# Patient Record
Sex: Male | Born: 1970 | Race: White | Hispanic: No | Marital: Married | Smoking: Former smoker
Health system: Southern US, Community
[De-identification: ages and names within clinical notes are randomized; demographics above are authoritative.]

## PROBLEM LIST (undated history)

## (undated) DIAGNOSIS — K219 Gastro-esophageal reflux disease without esophagitis: Secondary | ICD-10-CM

## (undated) DIAGNOSIS — E785 Hyperlipidemia, unspecified: Secondary | ICD-10-CM

## (undated) DIAGNOSIS — L309 Dermatitis, unspecified: Secondary | ICD-10-CM

## (undated) DIAGNOSIS — J302 Other seasonal allergic rhinitis: Secondary | ICD-10-CM

## (undated) DIAGNOSIS — L639 Alopecia areata, unspecified: Secondary | ICD-10-CM

## (undated) DIAGNOSIS — G4733 Obstructive sleep apnea (adult) (pediatric): Secondary | ICD-10-CM

## (undated) DIAGNOSIS — T7840XA Allergy, unspecified, initial encounter: Secondary | ICD-10-CM

## (undated) HISTORY — DX: Other seasonal allergic rhinitis: J30.2

## (undated) HISTORY — DX: Dermatitis, unspecified: L30.9

## (undated) HISTORY — PX: EYE SURGERY: SHX253

## (undated) HISTORY — DX: Obstructive sleep apnea (adult) (pediatric): G47.33

## (undated) HISTORY — DX: Hyperlipidemia, unspecified: E78.5

## (undated) HISTORY — DX: Alopecia areata, unspecified: L63.9

## (undated) HISTORY — DX: Gastro-esophageal reflux disease without esophagitis: K21.9

## (undated) HISTORY — DX: Allergy, unspecified, initial encounter: T78.40XA

---

## 2010-08-19 ENCOUNTER — Encounter: Payer: Self-pay | Admitting: Pulmonary Disease

## 2010-08-20 ENCOUNTER — Ambulatory Visit (INDEPENDENT_AMBULATORY_CARE_PROVIDER_SITE_OTHER): Payer: 59 | Admitting: Pulmonary Disease

## 2010-08-20 ENCOUNTER — Encounter: Payer: Self-pay | Admitting: Pulmonary Disease

## 2010-08-20 VITALS — BP 118/80 | HR 86 | Temp 98.0°F | Ht 71.0 in | Wt 200.4 lb

## 2010-08-20 DIAGNOSIS — G4733 Obstructive sleep apnea (adult) (pediatric): Secondary | ICD-10-CM

## 2010-08-20 NOTE — Patient Instructions (Signed)
Work on modest weight loss Will refer to ENT for upper airway evaluation. Review material on dental appliance, and let me know if you would like referral to dental medicine for evaluation.

## 2010-08-20 NOTE — Progress Notes (Signed)
  Subjective:    Patient ID: Mark Obrien, male    DOB: 21-Feb-1970, 40 y.o.   MRN: [id number]  HPI The pt comes in today for ongoing issues with sleep.  He has been diagnosed with mild osa, and has been trying to wear cpap for treatment.  He has not been seen since 2009, and has been wearing cpap intermittently.  He has had ongoing issues with mask fit, and states that even when he wears cpap he continues to snore and sleep poorly.    Sleep Questionnaire: What time do you typically go to bed?( Between what hours) 11:30 pm to 1 am How long does it take you to fall asleep? 1 min How many times during the night do you wake up? What time do you get out of bed to start your day? 0730 Do you drive or operate heavy machinery in your occupation? How much has your weight changed (up or down) over the past two years? (In pounds) 20 lb (9.072 kg) Have you ever had a sleep study before? Yes If yes, location of study? [facility] If yes, date of study? 03/2007 and 05/2007 Do you currently use CPAP? Yes If so, what pressure? 11? Do you wear oxygen at any time? No     Review of Systems  Constitutional: Positive for unexpected weight change. Negative for fever.  HENT: Positive for congestion and dental problem. Negative for ear pain, nosebleeds, sore throat, rhinorrhea, sneezing, trouble swallowing, postnasal drip and sinus pressure.   Eyes: Negative for redness and itching.  Respiratory: Negative for cough, chest tightness, shortness of breath and wheezing.   Cardiovascular: Positive for chest pain. Negative for palpitations and leg swelling.  Gastrointestinal: Negative for nausea and vomiting.  Genitourinary: Negative for dysuria.  Musculoskeletal: Positive for joint swelling.  Skin: Negative for rash.  Neurological: Positive for headaches.  Hematological: Does not bruise/bleed easily.  Psychiatric/Behavioral: Negative for dysphoric mood. The patient is nervous/anxious.        Objective:   Physical  Exam Constitutional:  Well developed, no acute distress  HENT:  Nares with deviation to right with complete obstruction, left narrowed with turbinate hypertrophy  Oropharynx without exudate, palate and uvula are moderately elongated.   Eyes:  Perrla, eomi, no scleral icterus  Neck:  No JVD, no TMG  Cardiovascular:  Normal rate, regular rhythm, no rubs or gallops.  No murmurs        Intact distal pulses  Pulmonary :  Normal breath sounds, no stridor or respiratory distress   No rales, rhonchi, or wheezing  Abdominal:  Soft, nondistended, bowel sounds present.  No tenderness noted.   Musculoskeletal:  No lower extremity edema noted.  Lymph Nodes:  No cervical lymphadenopathy noted  Skin:  No cyanosis noted  Neurologic:  Alert, appropriate, moves all 4 extremities without obvious deficit.         Assessment & Plan:

## 2010-08-24 ENCOUNTER — Encounter: Payer: Self-pay | Admitting: Pulmonary Disease

## 2010-08-25 NOTE — Assessment & Plan Note (Addendum)
The pt has mild osa by his sleep study in the past, and has been doing poorly with cpap for various reasons.  Mask fitting has been difficult, and he has been having breakthru snoring either due to pressure loss or inadequate pressure delivery.  I have offered to work with him on cpap fitting and troubleshooting, but he would like to consider other options.  Have discussed upper airway surgery, especially with his abnormal nasal anatomy, as well as a dental appliance.  Literature was given to the pt about a dental appliance, and referral will be made to ENT.  I have also encouraged him to work on weight loss.

## 2013-06-28 ENCOUNTER — Other Ambulatory Visit: Payer: Self-pay | Admitting: Family Medicine

## 2013-06-28 ENCOUNTER — Ambulatory Visit
Admission: RE | Admit: 2013-06-28 | Discharge: 2013-06-28 | Disposition: A | Discharge status: Home / Self Care | Payer: BC Managed Care – PPO | Source: Ambulatory Visit | Attending: Family Medicine | Admitting: Family Medicine

## 2013-06-28 DIAGNOSIS — N5089 Other specified disorders of the male genital organs: Secondary | ICD-10-CM

## 2014-08-14 ENCOUNTER — Encounter: Payer: Self-pay | Admitting: Family

## 2014-08-14 ENCOUNTER — Ambulatory Visit (INDEPENDENT_AMBULATORY_CARE_PROVIDER_SITE_OTHER): Payer: 59 | Admitting: Family

## 2014-08-14 ENCOUNTER — Telehealth: Payer: Self-pay | Admitting: Family

## 2014-08-14 ENCOUNTER — Ambulatory Visit: Payer: 59 | Admitting: Family

## 2014-08-14 ENCOUNTER — Encounter (INDEPENDENT_AMBULATORY_CARE_PROVIDER_SITE_OTHER): Payer: Self-pay

## 2014-08-14 ENCOUNTER — Other Ambulatory Visit (INDEPENDENT_AMBULATORY_CARE_PROVIDER_SITE_OTHER): Payer: 59

## 2014-08-14 VITALS — BP 110/82 | HR 69 | Temp 98.2°F | Resp 18 | Ht 70.5 in | Wt 210.0 lb

## 2014-08-14 DIAGNOSIS — K219 Gastro-esophageal reflux disease without esophagitis: Secondary | ICD-10-CM | POA: Diagnosis not present

## 2014-08-14 DIAGNOSIS — E785 Hyperlipidemia, unspecified: Secondary | ICD-10-CM | POA: Diagnosis not present

## 2014-08-14 DIAGNOSIS — R350 Frequency of micturition: Secondary | ICD-10-CM

## 2014-08-14 LAB — COMPREHENSIVE METABOLIC PANEL
ALBUMIN: 4.3 g/dL (ref 3.5–5.2)
ALT: 59 U/L — ABNORMAL HIGH (ref 0–53)
AST: 31 U/L (ref 0–37)
Alkaline Phosphatase: 64 U/L (ref 39–117)
BUN: 13 mg/dL (ref 6–23)
CALCIUM: 9.5 mg/dL (ref 8.4–10.5)
CHLORIDE: 104 meq/L (ref 96–112)
CO2: 29 meq/L (ref 19–32)
Creatinine, Ser: 0.85 mg/dL (ref 0.40–1.50)
GFR: 103.23 mL/min (ref >60.00)
Glucose, Bld: 85 mg/dL (ref 70–99)
POTASSIUM: 4.3 meq/L (ref 3.5–5.1)
Sodium: 140 mEq/L (ref 135–145)
Total Bilirubin: 0.7 mg/dL (ref 0.2–1.2)
Total Protein: 7.3 g/dL (ref 6.0–8.3)

## 2014-08-14 LAB — HEMOGLOBIN A1C: HEMOGLOBIN A1C: 5.7 % (ref 4.6–6.5)

## 2014-08-14 LAB — PSA: PSA: 0.53 ng/mL (ref 0.10–4.00)

## 2014-08-14 MED ORDER — OMEPRAZOLE 10 MG PO CPDR: 10.0000 mg | DELAYED_RELEASE_CAPSULE | Freq: Every day | ORAL | Status: DC

## 2014-08-14 NOTE — Telephone Encounter (Signed)
Please inform patient that his liver function, kidney function, electrolytes and prostate are all within the normal limits. His A1c was also 5.7 indicating that he does not have diabetes. Therefore the cause of his urinary frequency is not related to his prostate or diabetes and likely the symptoms he was experiencing are related hypoglycemia and not eating. I cannot rule out if caffeine intake is not increasing his urination if he takes it in. We can plan to follow up if his symptoms worsen

## 2014-08-14 NOTE — Progress Notes (Signed)
Subjective:    Patient ID: Mark Obrien, male    DOB: January 23, 1971, 44 y.o.   MRN: [id number]  Chief Complaint  Patient presents with  . Establish Care    CPE, fasting    HPI:  Mark Obrien is a 44 y.o. male with a PMH of sleep apnea, GERD, and hyperlipidemia who presents today for an office visit to establish care.    1) Concern for diabetes - Associated symptom of urinary frequency and becoming dizzy have been going on for several months. Notes that he travels for work and does not always eat right. Modifying factors include eating which helps to alleviate the symptoms.   No results found for: HGBA1C  2.) Hyperlipidemia - Stable with current regimen of atorvastatin. Takes the medications as prescribed and denies adverse side effects. Has not had blood work done recently.   3.) GERD - Stable with current regimen of omeprazole. Takes the medications as prescribed and denies adverse side effects.   No Known Allergies   Outpatient Prescriptions Prior to Visit  Medication Sig Dispense Refill  . atorvastatin (LIPITOR) 20 MG tablet Take 20 mg by mouth daily.      . dorzolamide-timolol (COSOPT) 22.3-6.8 MG/ML ophthalmic solution Place 1 drop into the left eye 2 (two) times daily.     . Multiple Vitamin (MULTIVITAMIN) capsule Take 1 capsule by mouth daily.      . Coenzyme Q10 (COQ10) 100 MG CAPS Take 1 capsule by mouth daily.      . fish oil-omega-3 fatty acids 1000 MG capsule Take 4 capsules by mouth daily.       No facility-administered medications prior to visit.     Past Medical History  Diagnosis Date  . Alopecia areata   . Eczema   . GERD (gastroesophageal reflux disease)   . Seasonal allergies   . Vitamin D deficiency   . Hyperlipidemia   . Glaucoma   . OSA (obstructive sleep apnea)   . Allergy      Past Surgical History  Procedure Laterality Date  . Eye surgery      Left     Family History  Problem Relation Age of Onset  . Hypertension Mother   .  Hyperlipidemia Mother   . Heart disease Maternal Grandmother   . Heart disease Maternal Grandfather   . Stomach cancer Maternal Grandmother   . Breast cancer Maternal Aunt   . Bone cancer Maternal Aunt      History   Social History  . Marital Status: Married    Spouse Name: Misty Stanley  . Number of Children: 2  . Years of Education: 14   Occupational History  . Counsellor    Social History Main Topics  . Smoking status: Former Smoker -- 5 years    Types: Cigars, E-cigarettes  . Smokeless tobacco: Never Used     Comment: smoked cigarettes x 20 years. 1-2 ppd. Quit 2008.  Now smokes 2 cigars daily.  . Alcohol Use: No  . Drug Use: No  . Sexual Activity: Not on file   Other Topics Concern  . Not on file   Social History Narrative   Fun: Be at home with family;   Denies religious beliefs effecting health care.    Review of Systems  Constitutional: Negative for fever and chills.  Respiratory: Negative for chest tightness and shortness of breath.   Cardiovascular: Negative for chest pain, palpitations and leg swelling.  Gastrointestinal: Negative for nausea, vomiting, abdominal pain,  diarrhea and constipation.  Endocrine: Positive for polyuria. Negative for polydipsia and polyphagia.      Objective:    BP 110/82 mmHg  Pulse 69  Temp(Src) 98.2 F (36.8 C) (Oral)  Resp 18  Ht 5' 10.5" (1.791 m)  Wt 210 lb (95.255 kg)  BMI 29.70 kg/m2  SpO2 98% Nursing note and vital signs reviewed.  Physical Exam  Constitutional: He is oriented to person, place, and time. He appears well-developed and well-nourished. No distress.  Cardiovascular: Normal rate, regular rhythm, normal heart sounds and intact distal pulses.   Pulmonary/Chest: Effort normal and breath sounds normal.  Neurological: He is alert and oriented to person, place, and time.  Skin: Skin is warm and dry.  Psychiatric: He has a normal mood and affect. His behavior is normal. Judgment and thought content  normal.       Assessment & Plan:   Problem List Items Addressed This Visit      Digestive   GERD (gastroesophageal reflux disease)    Stable with current dose of omeprazole. Denies adverse side effects. Continue current dose of omeprazole. Prescription refilled.      Relevant Medications   omeprazole (PRILOSEC) 10 MG capsule     Other   Urinary frequency - Primary    Urinary frequency of undetermined origin. Obtain A1c and PSA. Unlikely related to diabetes. Continue to monitor at this time. Patient has follow-up with urology for other neurological issues. Follow-up if symptoms worsen or fail to improve pending lab work.      Relevant Orders   Hemoglobin A1c   PSA   Hyperlipidemia    Stable maintained on atorvastatin. Denies myalgias. Continue current dose of atorvastatin. Obtain lipid profile.      Relevant Orders   Comp Met (CMET)

## 2014-08-14 NOTE — Assessment & Plan Note (Signed)
Urinary frequency of undetermined origin. Obtain A1c and PSA. Unlikely related to diabetes. Continue to monitor at this time. Patient has follow-up with urology for other neurological issues. Follow-up if symptoms worsen or fail to improve pending lab work.

## 2014-08-14 NOTE — Assessment & Plan Note (Signed)
Stable with current dose of omeprazole. Denies adverse side effects. Continue current dose of omeprazole. Prescription refilled.

## 2014-08-14 NOTE — Progress Notes (Signed)
Pre visit review using our clinic review tool, if applicable. No additional management support is needed unless otherwise documented below in the visit note. 

## 2014-08-14 NOTE — Patient Instructions (Signed)
Thank you for choosing [facility].  Summary/Instructions:  Please continue to take your medications as prescribed.   Your prescription(s) have been submitted to your pharmacy or been printed and provided for you. Please take as directed and contact our office if you believe you are having problem(s) with the medication(s) or have any questions.  Please stop by the lab on the basement level of the building for your blood work. Your results will be released to MyChart (or called to you) after review, usually within 72 hours after test completion. If any changes need to be made, you will be notified at that same time.  If your symptoms worsen or fail to improve, please contact our office for further instruction, or in case of emergency go directly to the emergency room at the closest medical facility.     

## 2014-08-14 NOTE — Assessment & Plan Note (Signed)
Stable maintained on atorvastatin. Denies myalgias. Continue current dose of atorvastatin. Obtain lipid profile.

## 2014-08-15 NOTE — Telephone Encounter (Signed)
Pt wanted cholesterol checked. Put in order for pt to get it done at his convenience pt is aware.

## 2014-08-17 ENCOUNTER — Telehealth: Payer: Self-pay | Admitting: Family

## 2014-08-17 NOTE — Telephone Encounter (Signed)
Patient would like to talk to you

## 2014-08-17 NOTE — Telephone Encounter (Signed)
Returned pts call. Wanted to let me know that he forgot to fast so can't get lipid panel done until monday

## 2014-08-20 ENCOUNTER — Other Ambulatory Visit (INDEPENDENT_AMBULATORY_CARE_PROVIDER_SITE_OTHER): Payer: 59

## 2014-08-20 DIAGNOSIS — E785 Hyperlipidemia, unspecified: Secondary | ICD-10-CM

## 2014-08-20 LAB — LIPID PANEL
Cholesterol: 166 mg/dL (ref 0–200)
HDL: 39.8 mg/dL (ref >39.00)
NonHDL: 126.2
Total CHOL/HDL Ratio: 4
Triglycerides: 278 mg/dL — ABNORMAL HIGH (ref 0.0–149.0)
VLDL: 55.6 mg/dL — ABNORMAL HIGH (ref 0.0–40.0)

## 2014-08-20 LAB — LDL CHOLESTEROL, DIRECT: LDL DIRECT: 90 mg/dL

## 2014-08-22 ENCOUNTER — Telehealth: Payer: Self-pay | Admitting: Family

## 2014-08-22 NOTE — Telephone Encounter (Signed)
Please inform patient that his cholesterol numbers were looked good, however his triglycerides (fat circulating in the blood) was elevated. Recommend decreasing the saturated fat in his diet and increasing fiber. We can follow up and recheck in 6 months.

## 2014-08-23 MED ORDER — ATORVASTATIN CALCIUM 20 MG PO TABS: 20.0000 mg | ORAL_TABLET | Freq: Every day | ORAL | Status: DC

## 2014-08-23 NOTE — Telephone Encounter (Signed)
Pt aware. New rx sent in for atorvastatin per pts request

## 2014-08-23 NOTE — Telephone Encounter (Signed)
Is he to continue to take cholesterol medication?

## 2014-08-23 NOTE — Telephone Encounter (Signed)
Yes please continue the cholesterol medication for now.

## 2014-10-18 ENCOUNTER — Other Ambulatory Visit: Payer: Self-pay | Admitting: Family

## 2014-11-12 ENCOUNTER — Other Ambulatory Visit: Payer: Self-pay | Admitting: Family

## 2014-12-14 ENCOUNTER — Other Ambulatory Visit: Payer: Self-pay | Admitting: Family

## 2015-01-30 ENCOUNTER — Other Ambulatory Visit: Payer: Self-pay | Admitting: Family

## 2015-02-05 ENCOUNTER — Encounter: Payer: Self-pay | Admitting: Family

## 2015-02-05 ENCOUNTER — Other Ambulatory Visit: Payer: Self-pay

## 2015-02-05 MED ORDER — ATORVASTATIN CALCIUM 20 MG PO TABS: ORAL_TABLET | ORAL | Status: DC

## 2015-02-19 ENCOUNTER — Other Ambulatory Visit: Payer: Self-pay | Admitting: Family

## 2015-04-08 ENCOUNTER — Other Ambulatory Visit: Payer: Self-pay | Admitting: Family

## 2015-05-20 ENCOUNTER — Other Ambulatory Visit: Payer: Self-pay | Admitting: Family

## 2015-06-04 ENCOUNTER — Other Ambulatory Visit: Payer: Self-pay | Admitting: Family

## 2015-09-29 ENCOUNTER — Other Ambulatory Visit: Payer: Self-pay | Admitting: Family

## 2015-12-27 ENCOUNTER — Other Ambulatory Visit: Payer: Self-pay | Admitting: Family

## 2016-03-02 DIAGNOSIS — R7989 Other specified abnormal findings of blood chemistry: Secondary | ICD-10-CM | POA: Diagnosis not present

## 2016-03-02 DIAGNOSIS — R739 Hyperglycemia, unspecified: Secondary | ICD-10-CM | POA: Diagnosis not present

## 2016-03-04 DIAGNOSIS — H402221 Chronic angle-closure glaucoma, left eye, mild stage: Secondary | ICD-10-CM | POA: Diagnosis not present

## 2016-03-23 DIAGNOSIS — Z0000 Encounter for general adult medical examination without abnormal findings: Secondary | ICD-10-CM | POA: Diagnosis not present

## 2016-03-23 DIAGNOSIS — E782 Mixed hyperlipidemia: Secondary | ICD-10-CM | POA: Diagnosis not present

## 2016-03-23 DIAGNOSIS — E559 Vitamin D deficiency, unspecified: Secondary | ICD-10-CM | POA: Diagnosis not present

## 2016-03-23 DIAGNOSIS — Z125 Encounter for screening for malignant neoplasm of prostate: Secondary | ICD-10-CM | POA: Diagnosis not present

## 2016-09-28 DIAGNOSIS — H402221 Chronic angle-closure glaucoma, left eye, mild stage: Secondary | ICD-10-CM | POA: Diagnosis not present

## 2016-10-01 DIAGNOSIS — H402222 Chronic angle-closure glaucoma, left eye, moderate stage: Secondary | ICD-10-CM | POA: Diagnosis not present

## 2016-11-05 DIAGNOSIS — H402222 Chronic angle-closure glaucoma, left eye, moderate stage: Secondary | ICD-10-CM | POA: Diagnosis not present

## 2016-12-14 DIAGNOSIS — H402222 Chronic angle-closure glaucoma, left eye, moderate stage: Secondary | ICD-10-CM | POA: Diagnosis not present

## 2016-12-28 DIAGNOSIS — M79672 Pain in left foot: Secondary | ICD-10-CM | POA: Diagnosis not present

## 2016-12-28 DIAGNOSIS — M25511 Pain in right shoulder: Secondary | ICD-10-CM | POA: Diagnosis not present

## 2016-12-28 DIAGNOSIS — M79671 Pain in right foot: Secondary | ICD-10-CM | POA: Diagnosis not present

## 2017-02-05 DIAGNOSIS — M79672 Pain in left foot: Secondary | ICD-10-CM | POA: Diagnosis not present

## 2017-02-05 DIAGNOSIS — M79671 Pain in right foot: Secondary | ICD-10-CM | POA: Diagnosis not present

## 2017-02-05 DIAGNOSIS — M24811 Other specific joint derangements of right shoulder, not elsewhere classified: Secondary | ICD-10-CM | POA: Diagnosis not present

## 2017-02-05 DIAGNOSIS — M67911 Unspecified disorder of synovium and tendon, right shoulder: Secondary | ICD-10-CM | POA: Diagnosis not present

## 2017-04-26 DIAGNOSIS — D225 Melanocytic nevi of trunk: Secondary | ICD-10-CM | POA: Diagnosis not present

## 2017-04-26 DIAGNOSIS — L821 Other seborrheic keratosis: Secondary | ICD-10-CM | POA: Diagnosis not present

## 2017-04-26 DIAGNOSIS — L814 Other melanin hyperpigmentation: Secondary | ICD-10-CM | POA: Diagnosis not present

## 2017-04-26 DIAGNOSIS — D1801 Hemangioma of skin and subcutaneous tissue: Secondary | ICD-10-CM | POA: Diagnosis not present

## 2017-05-04 DIAGNOSIS — E559 Vitamin D deficiency, unspecified: Secondary | ICD-10-CM | POA: Diagnosis not present

## 2017-05-04 DIAGNOSIS — E782 Mixed hyperlipidemia: Secondary | ICD-10-CM | POA: Diagnosis not present

## 2017-05-04 DIAGNOSIS — R7989 Other specified abnormal findings of blood chemistry: Secondary | ICD-10-CM | POA: Diagnosis not present

## 2017-05-04 DIAGNOSIS — R7303 Prediabetes: Secondary | ICD-10-CM | POA: Diagnosis not present

## 2017-05-04 DIAGNOSIS — K219 Gastro-esophageal reflux disease without esophagitis: Secondary | ICD-10-CM | POA: Diagnosis not present

## 2017-07-07 DIAGNOSIS — H402222 Chronic angle-closure glaucoma, left eye, moderate stage: Secondary | ICD-10-CM | POA: Diagnosis not present

## 2017-07-07 DIAGNOSIS — H21262 Iris atrophy (essential) (progressive), left eye: Secondary | ICD-10-CM | POA: Diagnosis not present

## 2017-07-07 DIAGNOSIS — H16322 Diffuse interstitial keratitis, left eye: Secondary | ICD-10-CM | POA: Diagnosis not present

## 2017-07-07 DIAGNOSIS — H4089 Other specified glaucoma: Secondary | ICD-10-CM | POA: Diagnosis not present

## 2017-07-07 DIAGNOSIS — H18892 Other specified disorders of cornea, left eye: Secondary | ICD-10-CM | POA: Diagnosis not present

## 2017-07-26 DIAGNOSIS — H402222 Chronic angle-closure glaucoma, left eye, moderate stage: Secondary | ICD-10-CM | POA: Diagnosis not present

## 2017-07-26 DIAGNOSIS — H21262 Iris atrophy (essential) (progressive), left eye: Secondary | ICD-10-CM | POA: Diagnosis not present

## 2017-07-26 DIAGNOSIS — H4089 Other specified glaucoma: Secondary | ICD-10-CM | POA: Diagnosis not present

## 2017-07-26 DIAGNOSIS — H16322 Diffuse interstitial keratitis, left eye: Secondary | ICD-10-CM | POA: Diagnosis not present

## 2017-08-10 DIAGNOSIS — H16322 Diffuse interstitial keratitis, left eye: Secondary | ICD-10-CM | POA: Diagnosis not present

## 2017-08-10 DIAGNOSIS — H402222 Chronic angle-closure glaucoma, left eye, moderate stage: Secondary | ICD-10-CM | POA: Diagnosis not present

## 2017-08-10 DIAGNOSIS — E785 Hyperlipidemia, unspecified: Secondary | ICD-10-CM | POA: Diagnosis not present

## 2017-08-10 DIAGNOSIS — Z79899 Other long term (current) drug therapy: Secondary | ICD-10-CM | POA: Diagnosis not present

## 2017-08-10 DIAGNOSIS — K219 Gastro-esophageal reflux disease without esophagitis: Secondary | ICD-10-CM | POA: Diagnosis not present

## 2017-08-10 DIAGNOSIS — G4733 Obstructive sleep apnea (adult) (pediatric): Secondary | ICD-10-CM | POA: Diagnosis not present

## 2017-08-10 DIAGNOSIS — H18892 Other specified disorders of cornea, left eye: Secondary | ICD-10-CM | POA: Diagnosis not present

## 2017-08-10 DIAGNOSIS — H21262 Iris atrophy (essential) (progressive), left eye: Secondary | ICD-10-CM | POA: Diagnosis not present

## 2017-08-20 DIAGNOSIS — R5383 Other fatigue: Secondary | ICD-10-CM | POA: Diagnosis not present

## 2017-08-20 DIAGNOSIS — Z0000 Encounter for general adult medical examination without abnormal findings: Secondary | ICD-10-CM | POA: Diagnosis not present

## 2017-08-20 DIAGNOSIS — R7303 Prediabetes: Secondary | ICD-10-CM | POA: Diagnosis not present

## 2017-11-15 DIAGNOSIS — M25811 Other specified joint disorders, right shoulder: Secondary | ICD-10-CM | POA: Diagnosis not present

## 2017-12-27 DIAGNOSIS — H16322 Diffuse interstitial keratitis, left eye: Secondary | ICD-10-CM | POA: Diagnosis not present

## 2017-12-27 DIAGNOSIS — H21262 Iris atrophy (essential) (progressive), left eye: Secondary | ICD-10-CM | POA: Diagnosis not present

## 2017-12-27 DIAGNOSIS — H402222 Chronic angle-closure glaucoma, left eye, moderate stage: Secondary | ICD-10-CM | POA: Diagnosis not present

## 2017-12-27 DIAGNOSIS — H4089 Other specified glaucoma: Secondary | ICD-10-CM | POA: Diagnosis not present

## 2018-03-14 DIAGNOSIS — R7303 Prediabetes: Secondary | ICD-10-CM | POA: Diagnosis not present

## 2018-03-14 DIAGNOSIS — E782 Mixed hyperlipidemia: Secondary | ICD-10-CM | POA: Diagnosis not present

## 2018-03-14 DIAGNOSIS — Z125 Encounter for screening for malignant neoplasm of prostate: Secondary | ICD-10-CM | POA: Diagnosis not present

## 2018-03-14 DIAGNOSIS — L409 Psoriasis, unspecified: Secondary | ICD-10-CM | POA: Diagnosis not present

## 2018-03-14 DIAGNOSIS — K219 Gastro-esophageal reflux disease without esophagitis: Secondary | ICD-10-CM | POA: Diagnosis not present

## 2018-04-11 DIAGNOSIS — H21262 Iris atrophy (essential) (progressive), left eye: Secondary | ICD-10-CM | POA: Diagnosis not present

## 2018-04-11 DIAGNOSIS — H16322 Diffuse interstitial keratitis, left eye: Secondary | ICD-10-CM | POA: Diagnosis not present

## 2018-04-11 DIAGNOSIS — H4089 Other specified glaucoma: Secondary | ICD-10-CM | POA: Diagnosis not present

## 2018-04-11 DIAGNOSIS — H402222 Chronic angle-closure glaucoma, left eye, moderate stage: Secondary | ICD-10-CM | POA: Diagnosis not present

## 2018-08-15 DIAGNOSIS — H4089 Other specified glaucoma: Secondary | ICD-10-CM | POA: Diagnosis not present

## 2018-08-15 DIAGNOSIS — H16322 Diffuse interstitial keratitis, left eye: Secondary | ICD-10-CM | POA: Diagnosis not present

## 2018-08-15 DIAGNOSIS — H21262 Iris atrophy (essential) (progressive), left eye: Secondary | ICD-10-CM | POA: Diagnosis not present

## 2018-08-15 DIAGNOSIS — H402222 Chronic angle-closure glaucoma, left eye, moderate stage: Secondary | ICD-10-CM | POA: Diagnosis not present

## 2018-09-05 DIAGNOSIS — Z0000 Encounter for general adult medical examination without abnormal findings: Secondary | ICD-10-CM | POA: Diagnosis not present

## 2018-09-26 DIAGNOSIS — Z20828 Contact with and (suspected) exposure to other viral communicable diseases: Secondary | ICD-10-CM | POA: Diagnosis not present

## 2018-09-30 DIAGNOSIS — R7303 Prediabetes: Secondary | ICD-10-CM | POA: Diagnosis not present

## 2019-01-09 DIAGNOSIS — R7303 Prediabetes: Secondary | ICD-10-CM | POA: Diagnosis not present

## 2019-01-09 DIAGNOSIS — L600 Ingrowing nail: Secondary | ICD-10-CM | POA: Diagnosis not present

## 2019-01-09 DIAGNOSIS — E782 Mixed hyperlipidemia: Secondary | ICD-10-CM | POA: Diagnosis not present

## 2019-01-09 DIAGNOSIS — N503 Cyst of epididymis: Secondary | ICD-10-CM | POA: Diagnosis not present

## 2019-02-01 DIAGNOSIS — H21262 Iris atrophy (essential) (progressive), left eye: Secondary | ICD-10-CM | POA: Diagnosis not present

## 2019-02-01 DIAGNOSIS — H16322 Diffuse interstitial keratitis, left eye: Secondary | ICD-10-CM | POA: Diagnosis not present

## 2019-02-01 DIAGNOSIS — H402222 Chronic angle-closure glaucoma, left eye, moderate stage: Secondary | ICD-10-CM | POA: Diagnosis not present

## 2019-02-01 DIAGNOSIS — H4089 Other specified glaucoma: Secondary | ICD-10-CM | POA: Diagnosis not present

## 2019-02-15 DIAGNOSIS — M7542 Impingement syndrome of left shoulder: Secondary | ICD-10-CM | POA: Diagnosis not present

## 2019-02-15 DIAGNOSIS — M25512 Pain in left shoulder: Secondary | ICD-10-CM | POA: Diagnosis not present

## 2019-03-13 DIAGNOSIS — M7542 Impingement syndrome of left shoulder: Secondary | ICD-10-CM | POA: Diagnosis not present

## 2019-04-03 DIAGNOSIS — R7303 Prediabetes: Secondary | ICD-10-CM | POA: Diagnosis not present

## 2019-04-03 DIAGNOSIS — Z125 Encounter for screening for malignant neoplasm of prostate: Secondary | ICD-10-CM | POA: Diagnosis not present

## 2019-04-03 DIAGNOSIS — H402222 Chronic angle-closure glaucoma, left eye, moderate stage: Secondary | ICD-10-CM | POA: Diagnosis not present

## 2019-04-03 DIAGNOSIS — H25813 Combined forms of age-related cataract, bilateral: Secondary | ICD-10-CM | POA: Diagnosis not present

## 2019-04-03 DIAGNOSIS — E782 Mixed hyperlipidemia: Secondary | ICD-10-CM | POA: Diagnosis not present

## 2019-04-03 DIAGNOSIS — H5213 Myopia, bilateral: Secondary | ICD-10-CM | POA: Diagnosis not present

## 2019-04-03 DIAGNOSIS — E559 Vitamin D deficiency, unspecified: Secondary | ICD-10-CM | POA: Diagnosis not present

## 2019-10-06 DIAGNOSIS — H25813 Combined forms of age-related cataract, bilateral: Secondary | ICD-10-CM | POA: Diagnosis not present

## 2019-10-06 DIAGNOSIS — H25812 Combined forms of age-related cataract, left eye: Secondary | ICD-10-CM | POA: Diagnosis not present

## 2019-10-12 DIAGNOSIS — H25812 Combined forms of age-related cataract, left eye: Secondary | ICD-10-CM | POA: Diagnosis not present

## 2019-10-12 DIAGNOSIS — Z87891 Personal history of nicotine dependence: Secondary | ICD-10-CM | POA: Diagnosis not present

## 2019-10-12 DIAGNOSIS — H25813 Combined forms of age-related cataract, bilateral: Secondary | ICD-10-CM | POA: Diagnosis not present

## 2019-10-12 DIAGNOSIS — K219 Gastro-esophageal reflux disease without esophagitis: Secondary | ICD-10-CM | POA: Diagnosis not present

## 2019-10-12 DIAGNOSIS — G4730 Sleep apnea, unspecified: Secondary | ICD-10-CM | POA: Diagnosis not present

## 2019-11-13 DIAGNOSIS — M25511 Pain in right shoulder: Secondary | ICD-10-CM | POA: Diagnosis not present

## 2019-11-13 DIAGNOSIS — M7542 Impingement syndrome of left shoulder: Secondary | ICD-10-CM | POA: Diagnosis not present

## 2019-11-21 DIAGNOSIS — R1032 Left lower quadrant pain: Secondary | ICD-10-CM | POA: Diagnosis not present

## 2019-11-21 DIAGNOSIS — Z0184 Encounter for antibody response examination: Secondary | ICD-10-CM | POA: Diagnosis not present

## 2019-12-25 DIAGNOSIS — H402222 Chronic angle-closure glaucoma, left eye, moderate stage: Secondary | ICD-10-CM | POA: Diagnosis not present

## 2019-12-25 DIAGNOSIS — H21262 Iris atrophy (essential) (progressive), left eye: Secondary | ICD-10-CM | POA: Diagnosis not present

## 2020-01-03 DIAGNOSIS — I10 Essential (primary) hypertension: Secondary | ICD-10-CM | POA: Diagnosis not present

## 2020-01-25 DIAGNOSIS — R030 Elevated blood-pressure reading, without diagnosis of hypertension: Secondary | ICD-10-CM | POA: Diagnosis not present

## 2020-03-13 DIAGNOSIS — H2511 Age-related nuclear cataract, right eye: Secondary | ICD-10-CM | POA: Diagnosis not present

## 2020-03-13 DIAGNOSIS — H1820 Unspecified corneal edema: Secondary | ICD-10-CM | POA: Diagnosis not present

## 2020-03-13 DIAGNOSIS — Z9842 Cataract extraction status, left eye: Secondary | ICD-10-CM | POA: Diagnosis not present

## 2020-03-13 DIAGNOSIS — H402222 Chronic angle-closure glaucoma, left eye, moderate stage: Secondary | ICD-10-CM | POA: Diagnosis not present

## 2020-03-15 DIAGNOSIS — E782 Mixed hyperlipidemia: Secondary | ICD-10-CM | POA: Diagnosis not present

## 2020-03-15 DIAGNOSIS — L409 Psoriasis, unspecified: Secondary | ICD-10-CM | POA: Diagnosis not present

## 2020-03-15 DIAGNOSIS — Z125 Encounter for screening for malignant neoplasm of prostate: Secondary | ICD-10-CM | POA: Diagnosis not present

## 2020-03-15 DIAGNOSIS — R7303 Prediabetes: Secondary | ICD-10-CM | POA: Diagnosis not present

## 2020-03-15 DIAGNOSIS — I10 Essential (primary) hypertension: Secondary | ICD-10-CM | POA: Diagnosis not present

## 2020-04-01 DIAGNOSIS — H4089 Other specified glaucoma: Secondary | ICD-10-CM | POA: Diagnosis not present

## 2020-04-01 DIAGNOSIS — H402222 Chronic angle-closure glaucoma, left eye, moderate stage: Secondary | ICD-10-CM | POA: Diagnosis not present

## 2020-04-01 DIAGNOSIS — H21262 Iris atrophy (essential) (progressive), left eye: Secondary | ICD-10-CM | POA: Diagnosis not present

## 2020-04-01 DIAGNOSIS — H16322 Diffuse interstitial keratitis, left eye: Secondary | ICD-10-CM | POA: Diagnosis not present

## 2020-04-03 DIAGNOSIS — M19071 Primary osteoarthritis, right ankle and foot: Secondary | ICD-10-CM | POA: Diagnosis not present

## 2020-04-03 DIAGNOSIS — M79675 Pain in left toe(s): Secondary | ICD-10-CM | POA: Diagnosis not present

## 2020-08-23 DIAGNOSIS — Z8371 Family history of colonic polyps: Secondary | ICD-10-CM | POA: Diagnosis not present

## 2020-08-23 DIAGNOSIS — H409 Unspecified glaucoma: Secondary | ICD-10-CM | POA: Diagnosis not present

## 2020-08-23 DIAGNOSIS — Z01818 Encounter for other preprocedural examination: Secondary | ICD-10-CM | POA: Diagnosis not present

## 2020-09-09 DIAGNOSIS — H21262 Iris atrophy (essential) (progressive), left eye: Secondary | ICD-10-CM | POA: Diagnosis not present

## 2020-09-09 DIAGNOSIS — H4052X0 Glaucoma secondary to other eye disorders, left eye, stage unspecified: Secondary | ICD-10-CM | POA: Diagnosis not present

## 2020-09-09 DIAGNOSIS — H1820 Unspecified corneal edema: Secondary | ICD-10-CM | POA: Diagnosis not present

## 2020-09-09 DIAGNOSIS — H402222 Chronic angle-closure glaucoma, left eye, moderate stage: Secondary | ICD-10-CM | POA: Diagnosis not present

## 2020-09-23 DIAGNOSIS — Z0000 Encounter for general adult medical examination without abnormal findings: Secondary | ICD-10-CM | POA: Diagnosis not present

## 2020-09-23 DIAGNOSIS — I10 Essential (primary) hypertension: Secondary | ICD-10-CM | POA: Diagnosis not present

## 2020-09-23 DIAGNOSIS — R7303 Prediabetes: Secondary | ICD-10-CM | POA: Diagnosis not present

## 2020-09-23 DIAGNOSIS — E782 Mixed hyperlipidemia: Secondary | ICD-10-CM | POA: Diagnosis not present

## 2020-10-03 DIAGNOSIS — H1812 Bullous keratopathy, left eye: Secondary | ICD-10-CM | POA: Diagnosis not present

## 2020-10-03 DIAGNOSIS — G4730 Sleep apnea, unspecified: Secondary | ICD-10-CM | POA: Diagnosis not present

## 2020-10-03 DIAGNOSIS — K219 Gastro-esophageal reflux disease without esophagitis: Secondary | ICD-10-CM | POA: Diagnosis not present

## 2021-01-13 DIAGNOSIS — H402222 Chronic angle-closure glaucoma, left eye, moderate stage: Secondary | ICD-10-CM | POA: Diagnosis not present

## 2021-02-17 DIAGNOSIS — H402222 Chronic angle-closure glaucoma, left eye, moderate stage: Secondary | ICD-10-CM | POA: Diagnosis not present

## 2021-03-14 DIAGNOSIS — K649 Unspecified hemorrhoids: Secondary | ICD-10-CM | POA: Diagnosis not present

## 2021-03-14 DIAGNOSIS — Z1211 Encounter for screening for malignant neoplasm of colon: Secondary | ICD-10-CM | POA: Diagnosis not present

## 2021-03-14 DIAGNOSIS — D122 Benign neoplasm of ascending colon: Secondary | ICD-10-CM | POA: Diagnosis not present

## 2021-03-14 DIAGNOSIS — D123 Benign neoplasm of transverse colon: Secondary | ICD-10-CM | POA: Diagnosis not present

## 2021-03-14 DIAGNOSIS — D124 Benign neoplasm of descending colon: Secondary | ICD-10-CM | POA: Diagnosis not present

## 2021-03-14 DIAGNOSIS — Z8371 Family history of colonic polyps: Secondary | ICD-10-CM | POA: Diagnosis not present

## 2021-03-25 ENCOUNTER — Other Ambulatory Visit: Payer: Self-pay | Admitting: Orthopedic Surgery

## 2021-03-25 ENCOUNTER — Other Ambulatory Visit: Payer: Self-pay | Admitting: Physician Assistant

## 2021-03-25 DIAGNOSIS — S86012A Strain of left Achilles tendon, initial encounter: Secondary | ICD-10-CM

## 2021-03-28 ENCOUNTER — Ambulatory Visit
Admission: RE | Admit: 2021-03-28 | Discharge: 2021-03-28 | Disposition: A | Discharge status: Home / Self Care | Payer: Worker's Compensation | Source: Ambulatory Visit | Attending: Orthopedic Surgery | Admitting: Orthopedic Surgery

## 2021-03-28 ENCOUNTER — Other Ambulatory Visit: Payer: Self-pay

## 2021-03-28 DIAGNOSIS — S86012A Strain of left Achilles tendon, initial encounter: Secondary | ICD-10-CM

## 2021-03-31 DIAGNOSIS — R7303 Prediabetes: Secondary | ICD-10-CM | POA: Diagnosis not present

## 2021-03-31 DIAGNOSIS — I10 Essential (primary) hypertension: Secondary | ICD-10-CM | POA: Diagnosis not present

## 2021-03-31 DIAGNOSIS — H409 Unspecified glaucoma: Secondary | ICD-10-CM | POA: Diagnosis not present

## 2021-03-31 DIAGNOSIS — E782 Mixed hyperlipidemia: Secondary | ICD-10-CM | POA: Diagnosis not present

## 2021-04-07 DIAGNOSIS — Z947 Corneal transplant status: Secondary | ICD-10-CM | POA: Diagnosis not present

## 2021-04-07 DIAGNOSIS — Z961 Presence of intraocular lens: Secondary | ICD-10-CM | POA: Diagnosis not present

## 2021-04-07 DIAGNOSIS — H402222 Chronic angle-closure glaucoma, left eye, moderate stage: Secondary | ICD-10-CM | POA: Diagnosis not present

## 2021-04-07 DIAGNOSIS — H2511 Age-related nuclear cataract, right eye: Secondary | ICD-10-CM | POA: Diagnosis not present

## 2021-04-23 ENCOUNTER — Other Ambulatory Visit: Payer: Self-pay | Admitting: Orthopedic Surgery

## 2021-04-23 DIAGNOSIS — S52122A Displaced fracture of head of left radius, initial encounter for closed fracture: Secondary | ICD-10-CM

## 2021-04-23 DIAGNOSIS — M25532 Pain in left wrist: Secondary | ICD-10-CM | POA: Diagnosis not present

## 2021-04-23 DIAGNOSIS — S52125A Nondisplaced fracture of head of left radius, initial encounter for closed fracture: Secondary | ICD-10-CM | POA: Diagnosis not present

## 2021-04-28 ENCOUNTER — Ambulatory Visit
Admission: RE | Admit: 2021-04-28 | Discharge: 2021-04-28 | Disposition: A | Discharge status: Home / Self Care | Payer: BC Managed Care – PPO | Source: Ambulatory Visit | Attending: Orthopedic Surgery | Admitting: Orthopedic Surgery

## 2021-04-28 DIAGNOSIS — M25422 Effusion, left elbow: Secondary | ICD-10-CM | POA: Diagnosis not present

## 2021-04-28 DIAGNOSIS — S52132A Displaced fracture of neck of left radius, initial encounter for closed fracture: Secondary | ICD-10-CM | POA: Diagnosis not present

## 2021-04-28 DIAGNOSIS — M7989 Other specified soft tissue disorders: Secondary | ICD-10-CM | POA: Diagnosis not present

## 2021-04-28 DIAGNOSIS — S52122A Displaced fracture of head of left radius, initial encounter for closed fracture: Secondary | ICD-10-CM

## 2021-05-01 DIAGNOSIS — H402222 Chronic angle-closure glaucoma, left eye, moderate stage: Secondary | ICD-10-CM | POA: Diagnosis not present

## 2021-05-02 DIAGNOSIS — Y999 Unspecified external cause status: Secondary | ICD-10-CM | POA: Diagnosis not present

## 2021-05-02 DIAGNOSIS — S52122A Displaced fracture of head of left radius, initial encounter for closed fracture: Secondary | ICD-10-CM | POA: Diagnosis not present

## 2021-06-27 DIAGNOSIS — M6281 Muscle weakness (generalized): Secondary | ICD-10-CM | POA: Diagnosis not present

## 2021-06-27 DIAGNOSIS — S52122D Displaced fracture of head of left radius, subsequent encounter for closed fracture with routine healing: Secondary | ICD-10-CM | POA: Diagnosis not present

## 2021-09-01 DIAGNOSIS — Z961 Presence of intraocular lens: Secondary | ICD-10-CM | POA: Diagnosis not present

## 2021-09-01 DIAGNOSIS — Z947 Corneal transplant status: Secondary | ICD-10-CM | POA: Diagnosis not present

## 2021-09-01 DIAGNOSIS — H2511 Age-related nuclear cataract, right eye: Secondary | ICD-10-CM | POA: Diagnosis not present

## 2021-09-01 DIAGNOSIS — H402222 Chronic angle-closure glaucoma, left eye, moderate stage: Secondary | ICD-10-CM | POA: Diagnosis not present

## 2021-09-26 DIAGNOSIS — G5601 Carpal tunnel syndrome, right upper limb: Secondary | ICD-10-CM | POA: Diagnosis not present

## 2021-09-26 DIAGNOSIS — I10 Essential (primary) hypertension: Secondary | ICD-10-CM | POA: Diagnosis not present

## 2021-10-21 DIAGNOSIS — E782 Mixed hyperlipidemia: Secondary | ICD-10-CM | POA: Diagnosis not present

## 2021-10-21 DIAGNOSIS — Z125 Encounter for screening for malignant neoplasm of prostate: Secondary | ICD-10-CM | POA: Diagnosis not present

## 2021-10-21 DIAGNOSIS — R7303 Prediabetes: Secondary | ICD-10-CM | POA: Diagnosis not present

## 2021-10-21 DIAGNOSIS — Z0000 Encounter for general adult medical examination without abnormal findings: Secondary | ICD-10-CM | POA: Diagnosis not present

## 2021-10-21 DIAGNOSIS — I10 Essential (primary) hypertension: Secondary | ICD-10-CM | POA: Diagnosis not present

## 2021-11-17 DIAGNOSIS — L814 Other melanin hyperpigmentation: Secondary | ICD-10-CM | POA: Diagnosis not present

## 2021-11-17 DIAGNOSIS — D225 Melanocytic nevi of trunk: Secondary | ICD-10-CM | POA: Diagnosis not present

## 2021-11-17 DIAGNOSIS — L400 Psoriasis vulgaris: Secondary | ICD-10-CM | POA: Diagnosis not present

## 2021-12-29 DIAGNOSIS — H402222 Chronic angle-closure glaucoma, left eye, moderate stage: Secondary | ICD-10-CM | POA: Diagnosis not present

## 2022-03-23 DIAGNOSIS — H402222 Chronic angle-closure glaucoma, left eye, moderate stage: Secondary | ICD-10-CM | POA: Diagnosis not present

## 2022-04-23 DIAGNOSIS — E559 Vitamin D deficiency, unspecified: Secondary | ICD-10-CM | POA: Diagnosis not present

## 2022-04-23 DIAGNOSIS — I10 Essential (primary) hypertension: Secondary | ICD-10-CM | POA: Diagnosis not present

## 2022-04-23 DIAGNOSIS — R7303 Prediabetes: Secondary | ICD-10-CM | POA: Diagnosis not present

## 2022-04-23 DIAGNOSIS — E782 Mixed hyperlipidemia: Secondary | ICD-10-CM | POA: Diagnosis not present

## 2022-11-02 DIAGNOSIS — I10 Essential (primary) hypertension: Secondary | ICD-10-CM | POA: Diagnosis not present

## 2022-11-02 DIAGNOSIS — E119 Type 2 diabetes mellitus without complications: Secondary | ICD-10-CM | POA: Diagnosis not present

## 2022-11-02 DIAGNOSIS — Z0000 Encounter for general adult medical examination without abnormal findings: Secondary | ICD-10-CM | POA: Diagnosis not present

## 2022-11-02 DIAGNOSIS — E782 Mixed hyperlipidemia: Secondary | ICD-10-CM | POA: Diagnosis not present

## 2022-11-02 DIAGNOSIS — Z125 Encounter for screening for malignant neoplasm of prostate: Secondary | ICD-10-CM | POA: Diagnosis not present

## 2022-11-05 DIAGNOSIS — H402222 Chronic angle-closure glaucoma, left eye, moderate stage: Secondary | ICD-10-CM | POA: Diagnosis not present

## 2022-11-06 DIAGNOSIS — H402222 Chronic angle-closure glaucoma, left eye, moderate stage: Secondary | ICD-10-CM | POA: Diagnosis not present

## 2022-11-06 DIAGNOSIS — H2511 Age-related nuclear cataract, right eye: Secondary | ICD-10-CM | POA: Diagnosis not present

## 2022-11-06 DIAGNOSIS — Z961 Presence of intraocular lens: Secondary | ICD-10-CM | POA: Diagnosis not present

## 2022-11-06 DIAGNOSIS — Z947 Corneal transplant status: Secondary | ICD-10-CM | POA: Diagnosis not present

## 2022-12-28 DIAGNOSIS — H402222 Chronic angle-closure glaucoma, left eye, moderate stage: Secondary | ICD-10-CM | POA: Diagnosis not present

## 2023-02-15 DIAGNOSIS — H402222 Chronic angle-closure glaucoma, left eye, moderate stage: Secondary | ICD-10-CM | POA: Diagnosis not present

## 2023-05-10 DIAGNOSIS — E782 Mixed hyperlipidemia: Secondary | ICD-10-CM | POA: Diagnosis not present

## 2023-05-10 DIAGNOSIS — E119 Type 2 diabetes mellitus without complications: Secondary | ICD-10-CM | POA: Diagnosis not present

## 2023-05-10 DIAGNOSIS — R945 Abnormal results of liver function studies: Secondary | ICD-10-CM | POA: Diagnosis not present

## 2023-05-10 DIAGNOSIS — I10 Essential (primary) hypertension: Secondary | ICD-10-CM | POA: Diagnosis not present

## 2023-05-10 DIAGNOSIS — L409 Psoriasis, unspecified: Secondary | ICD-10-CM | POA: Diagnosis not present

## 2023-05-17 DIAGNOSIS — H402222 Chronic angle-closure glaucoma, left eye, moderate stage: Secondary | ICD-10-CM | POA: Diagnosis not present

## 2023-05-21 ENCOUNTER — Encounter: Payer: Self-pay | Admitting: Podiatry

## 2023-05-21 ENCOUNTER — Ambulatory Visit: Admitting: Podiatry

## 2023-05-21 ENCOUNTER — Ambulatory Visit (INDEPENDENT_AMBULATORY_CARE_PROVIDER_SITE_OTHER)

## 2023-05-21 DIAGNOSIS — M19071 Primary osteoarthritis, right ankle and foot: Secondary | ICD-10-CM | POA: Diagnosis not present

## 2023-05-21 DIAGNOSIS — M7751 Other enthesopathy of right foot: Secondary | ICD-10-CM

## 2023-05-21 DIAGNOSIS — M722 Plantar fascial fibromatosis: Secondary | ICD-10-CM

## 2023-05-21 MED ORDER — TRIAMCINOLONE ACETONIDE 10 MG/ML IJ SUSP
5.0000 mg | Freq: Once | INTRAMUSCULAR | Status: AC
  Administered 2023-05-21: 5 mg via INTRAMUSCULAR

## 2023-05-21 NOTE — Patient Instructions (Signed)

## 2023-05-23 NOTE — Progress Notes (Signed)
  Subjective:  Patient ID: TOA MIA, male    DOB: 1970-03-17,  MRN: [id number]  Chief Complaint  Patient presents with   Foot Pain    RM#13 Right big toe pain for several months pain in joint.    Discussed the use of AI scribe software for clinical note transcription with the patient, who gave verbal consent to proceed.  History of Present Illness The patient presents with right foot pain, primarily localized to the big toe, but also affecting the entire foot. The discomfort began approximately six months ago and has been managed with Voltaren gel, which provides temporary relief. The pain is most severe upon waking, causing a noticeable limp. The patient denies any known injuries to the foot. He also reports some discomfort on the side of the foot, which he attributes to plantar fasciitis.  In addition to the foot pain, the patient has been experiencing leg cramping and burning in the calves, which he believes is related to tight hamstrings and plantar fasciitis. He has previously been fitted for orthotic inserts, but questions their effectiveness.      Objective:    Physical Exam EXTREMITIES: Right big toe joint not sore on palpation or painful on movement. Mild soreness on side of right foot. Bone spur on right heel.   No images are attached to the encounter.    Results   RADIOLOGY Foot X-ray: Narrow joint space in the first metatarsophalangeal joint, bone spur on the dorsal aspect of the foot, consistent with arthritis. Bone spur on the calcaneus.   Assessment:   1. Capsulitis of metatarsophalangeal (MTP) joint of right foot   2. Arthritis of first metatarsophalangeal (MTP) joint of right foot   3.      Plantar fasciitis    Plan:  Patient was evaluated and treated and all questions answered.  Assessment and Plan Assessment & Plan Right big toe arthritis - Continue Voltaren gel. - Apply ice. - Administer steroid injection.  Skin was cleaned with alcohol.   Mixture 1 cc Kenalog 10, 0.5 cc of Marcaine plain, 0.5 cc of lidocaine plain was infiltrated into around the first MTPJ without complications.  Postinjection care discussed.  Tolerated well. - Discuss surgical options if needed. - Refer to orthotist for custom shoe inserts.  He was seen today by Kerney Pee, pedorthist.  Plantar fasciitis Mild plantar fasciitis due to tight calf muscles and foot structure. Heel soreness present. - Continue Voltaren gel and icing. - Provide stretching exercises. - Recommend proper footwear. - Refer to orthotist for orthotic evaluation.   No follow-ups on file.   Charity Conch DPM

## 2023-07-07 DIAGNOSIS — H402222 Chronic angle-closure glaucoma, left eye, moderate stage: Secondary | ICD-10-CM | POA: Diagnosis not present

## 2023-07-15 IMAGING — CT CT ELBOW*L* W/O CM
1 series · 12 of 14 positions shown, 15 images · non-contrast
Comparison: None.

CLINICAL DATA: Evaluate left radial head fracture

EXAM:
CT OF THE UPPER LEFT EXTREMITY WITHOUT CONTRAST
TECHNIQUE: Multidetector CT imaging of the upper left extremity was performed
according to the standard protocol.
RADIATION DOSE REDUCTION: This exam was performed according to the
departmental dose-optimization program which includes automated
exposure control, adjustment of the mA and/or kV according to
patient size and/or use of iterative reconstruction technique.

[Series 5: upper ext 1.5 st · axial · 0.31mm/px · z∈[+36,+142]mm · 12 of 85 slices shown, 15 images]
[im 7/85  soft-tissue]
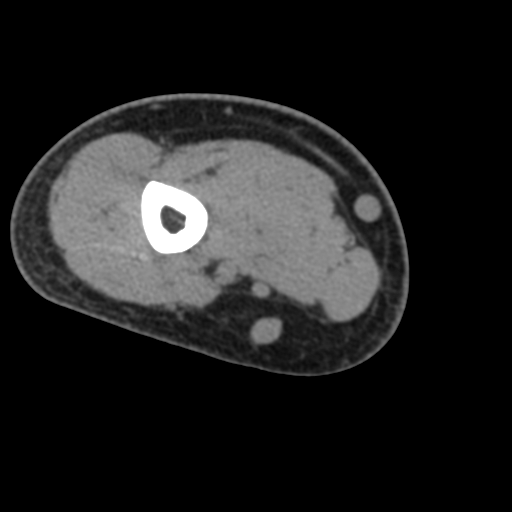
[im 7/85  bone]
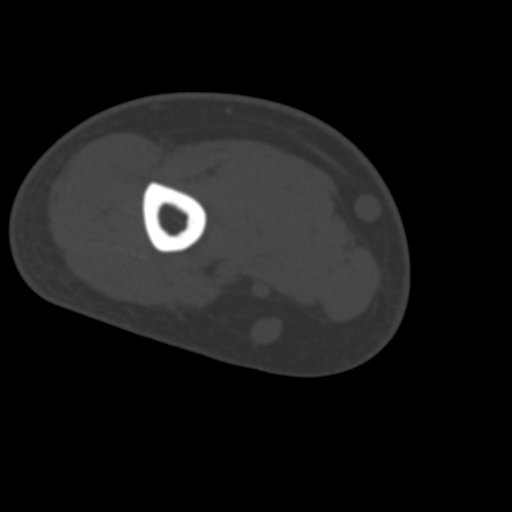
[im 13/85  bone]
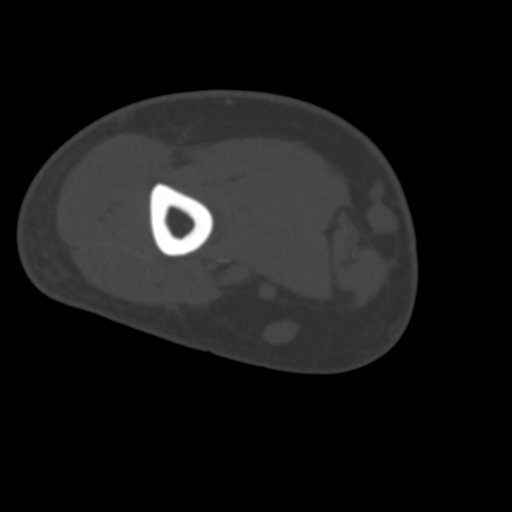
[im 20/85  bone]
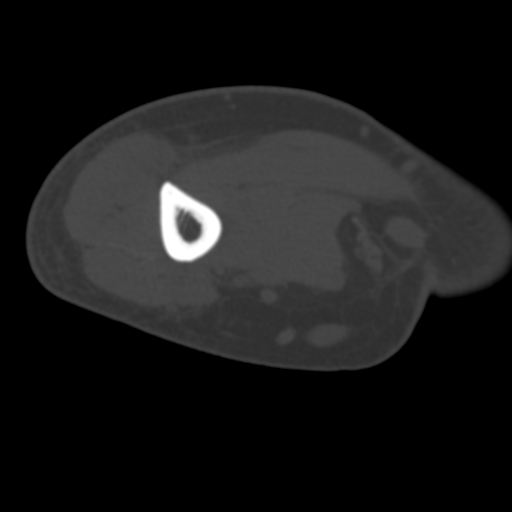
[im 26/85  bone]
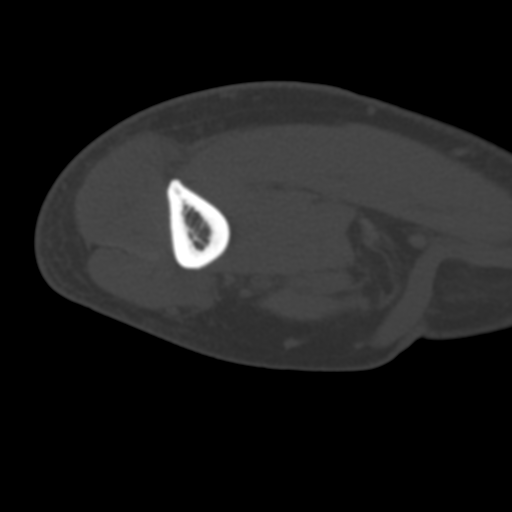
[im 33/85  soft-tissue]
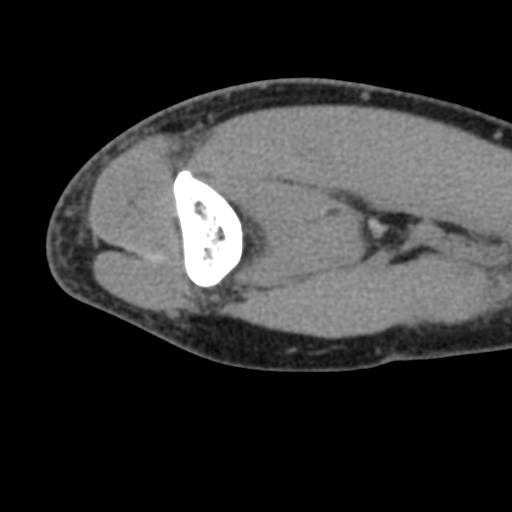
[im 33/85  bone]
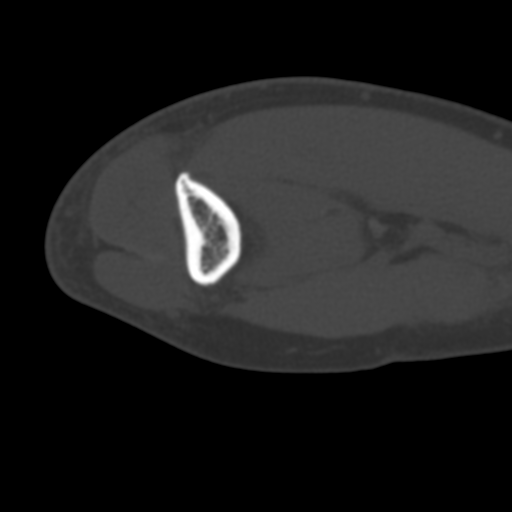
[im 39/85  bone]
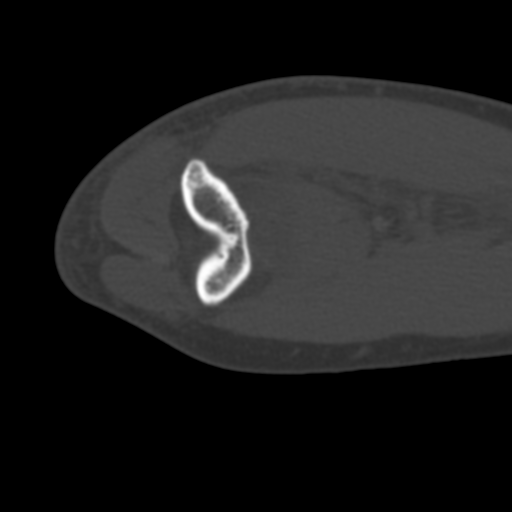
[im 46/85  bone]
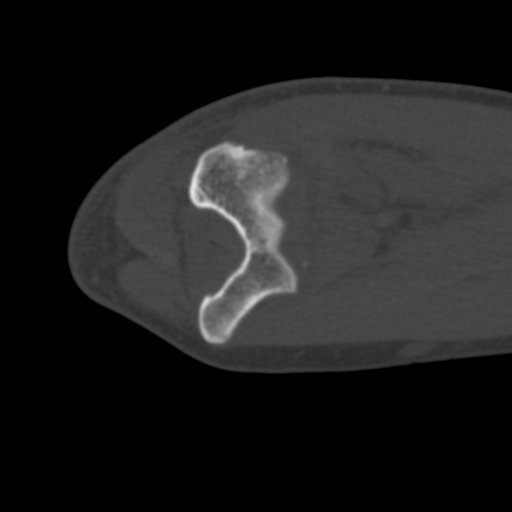
[im 52/85  bone]
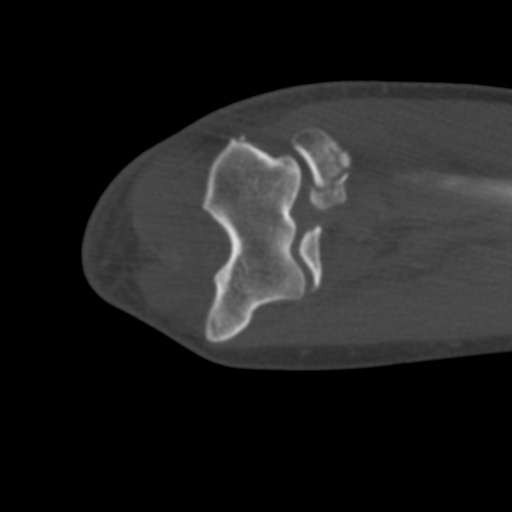
[im 59/85  soft-tissue]
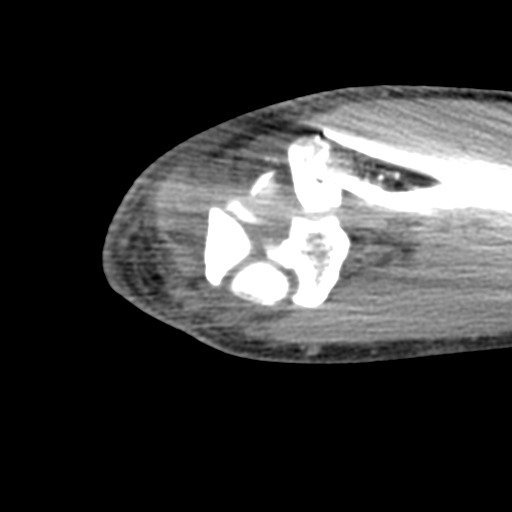
[im 59/85  bone]
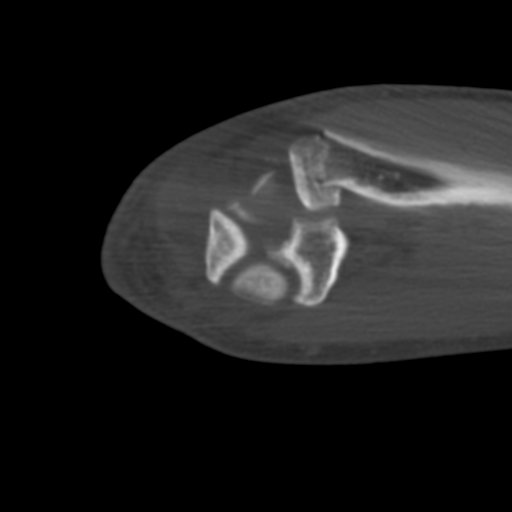
[im 65/85  bone]
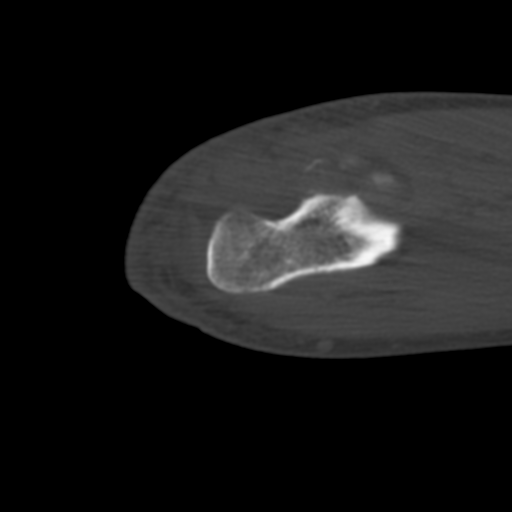
[im 72/85  bone]
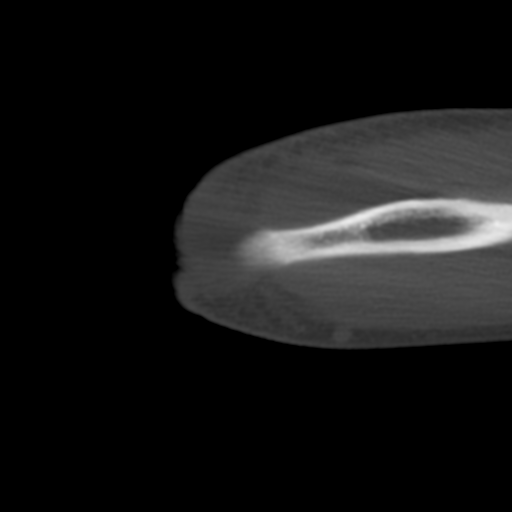
[im 78/85  bone]
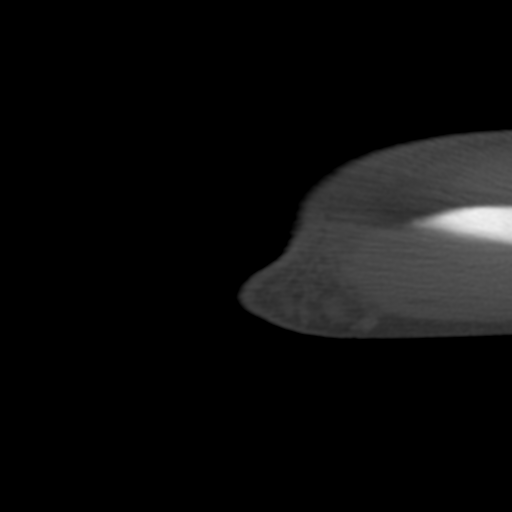

[12 of 14 positions shown; findings below may reference images not displayed]

FINDINGS: Bones/Joint/Cartilage

There is a comminuted and mildly displaced intra-articular radial
head/neck fracture with mild impaction. There is a large joint
effusion/hemarthrosis. Tiny calcifications within the anterior
aspect of the joint medially (series 11, images 45-46). There is a
tiny bone fragment adjacent to the lateral aspect of the coronoid
process (series 8, image 49). There is a displaced fracture
involving the supinator crest of the proximal ulna (series 8, image
43).

Ligaments

Suboptimally assessed by CT.

Muscles and Tendons

No intramuscular edema or muscle atrophy. No acute myotendinous
abnormality on noncontrast CT.

Soft tissues

Soft tissue swelling along the elbow.
IMPRESSION: Comminuted and mildly displaced intra-articular radial head/neck
fracture with mild impaction.

Probable tiny chip fracture along the lateral aspect of the coronoid
process.

Displaced fracture involving the supinator crest of the proximal
ulna, possibly due to a lateral ulnar collateral ligament avulsion.
The possibility of posterolateral instability should be considered.

Large joint effusion/hemarthrosis. Tiny calcifications within the
anterior aspect of the joint medially, possibly small
intra-articular fracture fragments.

## 2023-07-16 ENCOUNTER — Other Ambulatory Visit

## 2023-08-09 DIAGNOSIS — H402222 Chronic angle-closure glaucoma, left eye, moderate stage: Secondary | ICD-10-CM | POA: Diagnosis not present

## 2023-08-19 ENCOUNTER — Ambulatory Visit

## 2023-08-19 NOTE — Progress Notes (Signed)
 Patient presents today to pick up custom molded foot orthotics, diagnosed with Capsulits by Dr. Gershon.   Orthotics were dispensed and fit was satisfactory. Reviewed instructions for break-in and wear. Written instructions given to patient.  Patient will follow up as needed.   Lolita Schultze CPed, CFo, CFm

## 2023-11-04 DIAGNOSIS — H402222 Chronic angle-closure glaucoma, left eye, moderate stage: Secondary | ICD-10-CM | POA: Diagnosis not present

## 2023-12-31 DIAGNOSIS — R10A1 Flank pain, right side: Secondary | ICD-10-CM | POA: Diagnosis not present

## 2023-12-31 DIAGNOSIS — Z87438 Personal history of other diseases of male genital organs: Secondary | ICD-10-CM | POA: Diagnosis not present

## 2023-12-31 DIAGNOSIS — R399 Unspecified symptoms and signs involving the genitourinary system: Secondary | ICD-10-CM | POA: Diagnosis not present
